# Patient Record
Sex: Male | Born: 1985 | Race: White | Hispanic: No | Marital: Married | Smoking: Current every day smoker
Health system: Southern US, Community
[De-identification: ages and names within clinical notes are randomized; demographics above are authoritative.]

---
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
## 2008-01-24 ENCOUNTER — Emergency Department (HOSPITAL_COMMUNITY): Admission: EM | Admit: 2008-01-24 | Discharge: 2008-01-24 | Payer: Self-pay | Admitting: Emergency Medicine

## 2009-02-27 ENCOUNTER — Emergency Department: Payer: Self-pay | Admitting: Emergency Medicine

## 2010-07-08 ENCOUNTER — Emergency Department (HOSPITAL_COMMUNITY): Admission: EM | Admit: 2010-07-08 | Discharge: 2010-07-08 | Payer: Self-pay | Admitting: Emergency Medicine

## 2010-07-10 ENCOUNTER — Emergency Department (HOSPITAL_COMMUNITY): Admission: EM | Admit: 2010-07-10 | Discharge: 2010-07-10 | Payer: Self-pay | Admitting: Emergency Medicine

## 2010-10-05 ENCOUNTER — Emergency Department (HOSPITAL_COMMUNITY)
Admission: EM | Admit: 2010-10-05 | Discharge: 2010-10-05 | Payer: Self-pay | Source: Home / Self Care | Admitting: Emergency Medicine

## 2011-05-16 ENCOUNTER — Emergency Department (HOSPITAL_COMMUNITY)
Admission: EM | Admit: 2011-05-16 | Discharge: 2011-05-17 | Disposition: A | Discharge status: Home / Self Care | Payer: BC Managed Care – PPO | Attending: Emergency Medicine | Admitting: Emergency Medicine

## 2011-05-16 DIAGNOSIS — S0510XA Contusion of eyeball and orbital tissues, unspecified eye, initial encounter: Secondary | ICD-10-CM | POA: Insufficient documentation

## 2011-05-16 DIAGNOSIS — R51 Headache: Secondary | ICD-10-CM | POA: Insufficient documentation

## 2011-05-16 DIAGNOSIS — H538 Other visual disturbances: Secondary | ICD-10-CM | POA: Insufficient documentation

## 2011-05-16 DIAGNOSIS — H5710 Ocular pain, unspecified eye: Secondary | ICD-10-CM | POA: Insufficient documentation

## 2011-05-16 DIAGNOSIS — S058X9A Other injuries of unspecified eye and orbit, initial encounter: Secondary | ICD-10-CM | POA: Insufficient documentation

## 2011-05-16 DIAGNOSIS — H53149 Visual discomfort, unspecified: Secondary | ICD-10-CM | POA: Insufficient documentation

## 2011-05-16 DIAGNOSIS — IMO0002 Reserved for concepts with insufficient information to code with codable children: Secondary | ICD-10-CM | POA: Insufficient documentation

## 2015-02-23 ENCOUNTER — Emergency Department (HOSPITAL_COMMUNITY)
Admission: EM | Admit: 2015-02-23 | Discharge: 2015-02-23 | Disposition: A | Discharge status: Home / Self Care | Payer: Self-pay | Attending: Emergency Medicine | Admitting: Emergency Medicine

## 2015-02-23 ENCOUNTER — Encounter (HOSPITAL_COMMUNITY): Payer: Self-pay | Admitting: Physical Medicine and Rehabilitation

## 2015-02-23 ENCOUNTER — Emergency Department (HOSPITAL_COMMUNITY): Payer: Self-pay

## 2015-02-23 DIAGNOSIS — Y9389 Activity, other specified: Secondary | ICD-10-CM | POA: Insufficient documentation

## 2015-02-23 DIAGNOSIS — S62346A Nondisplaced fracture of base of fifth metacarpal bone, right hand, initial encounter for closed fracture: Secondary | ICD-10-CM | POA: Insufficient documentation

## 2015-02-23 DIAGNOSIS — Z720 Tobacco use: Secondary | ICD-10-CM | POA: Insufficient documentation

## 2015-02-23 DIAGNOSIS — Y998 Other external cause status: Secondary | ICD-10-CM | POA: Insufficient documentation

## 2015-02-23 DIAGNOSIS — Y9289 Other specified places as the place of occurrence of the external cause: Secondary | ICD-10-CM | POA: Insufficient documentation

## 2015-02-23 DIAGNOSIS — S62339A Displaced fracture of neck of unspecified metacarpal bone, initial encounter for closed fracture: Secondary | ICD-10-CM

## 2015-02-23 MED ORDER — IBUPROFEN 800 MG PO TABS: 800.0000 mg | ORAL_TABLET | Freq: Three times a day (TID) | ORAL | Status: AC

## 2015-02-23 MED ORDER — HYDROCODONE-ACETAMINOPHEN 5-325 MG PO TABS: 1.0000 | ORAL_TABLET | ORAL | Status: AC | PRN
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
## 2015-02-23 NOTE — Progress Notes (Signed)
Orthopedic Tech Progress Note Patient Details:  Derrick Thompson Marrow 07/04/1986 [id number]  Ortho Devices Type of Ortho Device: Ulna gutter splint Ortho Device/Splint Location: rue Ortho Device/Splint Interventions: Application   Nancyjo Givhan 02/23/2015, 1:50 PM

## 2015-02-23 NOTE — ED Notes (Signed)
Paged ortho to request short arm splint
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
## 2015-02-23 NOTE — ED Notes (Signed)
Ortho at bedside.
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
## 2016-03-18 ENCOUNTER — Emergency Department (HOSPITAL_COMMUNITY)
Admission: EM | Admit: 2016-03-18 | Discharge: 2016-03-18 | Disposition: A | Discharge status: Home / Self Care | Payer: Self-pay | Attending: Emergency Medicine | Admitting: Emergency Medicine

## 2016-03-18 ENCOUNTER — Encounter (HOSPITAL_COMMUNITY): Payer: Self-pay | Admitting: Emergency Medicine

## 2016-03-18 DIAGNOSIS — H9312 Tinnitus, left ear: Secondary | ICD-10-CM | POA: Insufficient documentation

## 2016-03-18 DIAGNOSIS — Z791 Long term (current) use of non-steroidal anti-inflammatories (NSAID): Secondary | ICD-10-CM | POA: Insufficient documentation

## 2016-03-18 DIAGNOSIS — F17200 Nicotine dependence, unspecified, uncomplicated: Secondary | ICD-10-CM | POA: Insufficient documentation

## 2016-03-18 DIAGNOSIS — H6122 Impacted cerumen, left ear: Secondary | ICD-10-CM | POA: Insufficient documentation

## 2016-03-18 DIAGNOSIS — R42 Dizziness and giddiness: Secondary | ICD-10-CM | POA: Insufficient documentation

## 2016-03-18 MED ORDER — DOCUSATE SODIUM 50 MG/5ML PO LIQD
50.0000 mg | Freq: Once | ORAL | Status: AC
  Administered 2016-03-18: 50 mg
  Filled 2016-03-18: qty 10

## 2016-03-18 NOTE — ED Notes (Signed)
Started with left ear "stopped up" 4-5 weeks ago. Has a friend in audiology who 'looked at it' and said it was badly impacted with wax. Pt has been trying home treatments in an attempt to clear the wax, without success.

## 2016-03-18 NOTE — Discharge Instructions (Signed)
Please read and follow all provided instructions.  Your diagnoses today include:  1. Cerumen impaction, left    Tests performed today include:  Vital signs. See below for your results today.   Medications prescribed:   None  Home care instructions:  Follow any educational materials contained in this packet.  Follow-up instructions: Please follow-up with your primary care provider for further evaluation of symptoms and treatment   Return instructions:   Please return to the Emergency Department if you do not get better, if you get worse, or new symptoms OR  - Fever (temperature greater than 101.8F)  - Bleeding that does not stop with holding pressure to the area    -Severe pain (please note that you may be more sore the day after your accident)  - Chest Pain  - Difficulty breathing  - Severe nausea or vomiting  - Inability to tolerate food and liquids  - Passing out  - Skin becoming red around your wounds  - Change in mental status (confusion or lethargy)  - New numbness or weakness     Please return if you have any other emergent concerns.  Additional Information:  Your vital signs today were: BP 131/89 mmHg   Pulse 84   Temp(Src) 97.8 F (36.6 C) (Oral)   Resp 18   Ht 6\' 4"  (1.93 m)   Wt 83.915 kg   BMI 22.53 kg/m2   SpO2 97% If your blood pressure (BP) was elevated above 135/85 this visit, please have this repeated by your doctor within one month. ---------------

## 2016-03-18 NOTE — ED Notes (Signed)
Requested med from pharmacy  

## 2016-03-18 NOTE — ED Provider Notes (Signed)
History  By signing my name below, I, Derrick Thompson, attest that this documentation has been prepared under the direction and in the presence of Audry Piliyler Shaheed Schmuck, PA-C. Electronically Signed: Karle PlumberJennifer Thompson, ED Scribe. 03/18/2016. 11:22 AM.  No chief complaint on file.  The history is provided by the patient and medical records. No language interpreter was used.    HPI Comments:  Derrick Thompson is a 30 y.o. male who presents to the Emergency Department complaining of decreased hearing in the left ear that has been ongoing for about 5 weeks. He reports associated tinnitus, mild dizziness and mild pain. He rates the pain at 6/10 but reports it as mild. He states a friend of his is an Biomedical scientistaudiologist and said he had an impaction after exploring it with a camera. He has been using OTC products to clear out the cerumen impaction without success. He denies modifying factors. He denies fever, chills, nausea, vomiting.   History reviewed. No pertinent past medical history. History reviewed. No pertinent past surgical history. No family history on file. Social History  Substance Use Topics  . Smoking status: Current Every Day Smoker  . Smokeless tobacco: None  . Alcohol Use: Yes    Review of Systems A complete 10 system review of systems was obtained and all systems are negative except as noted in the HPI and PMH.   Allergies  Review of patient's allergies indicates no known allergies.  Home Medications   Prior to Admission medications   Medication Sig Start Date End Date Taking? Authorizing Provider  HYDROcodone-acetaminophen (NORCO/VICODIN) 5-325 MG per tablet Take 1-2 tablets by mouth every 4 (four) hours as needed. 02/23/15   Tiffany Neva SeatGreene, PA-C  ibuprofen (ADVIL,MOTRIN) 800 MG tablet Take 1 tablet (800 mg total) by mouth 3 (three) times daily. 02/23/15   Marlon Peliffany Greene, PA-C   Triage Vitals: BP 131/89 mmHg  Pulse 84  Temp(Src) 97.8 F (36.6 C) (Oral)  Resp 18  Ht 6\' 4"  (1.93 m)  Wt  185 lb (83.915 kg)  BMI 22.53 kg/m2  SpO2 97%  Physical Exam  Constitutional: He is oriented to person, place, and time. He appears well-developed and well-nourished.  HENT:  Head: Normocephalic and atraumatic.  Right Ear: Hearing, tympanic membrane, external ear and ear canal normal.  Left ear with impacted canal with Cerumen. Unable to visualized TM. Canal non erythematous. No mastoid tenderness.   Eyes: EOM are normal.  Neck: Normal range of motion.  Cardiovascular: Normal rate and regular rhythm.   Pulmonary/Chest: Effort normal.  Abdominal: Soft.  Musculoskeletal: Normal range of motion.  Neurological: He is alert and oriented to person, place, and time.  Skin: Skin is warm and dry.  Psychiatric: He has a normal mood and affect. His behavior is normal. Thought content normal.  Nursing note and vitals reviewed.  ED Course  Procedures (including critical care time) DIAGNOSTIC STUDIES: Oxygen Saturation is 97% on RA, normal by my interpretation.   COORDINATION OF CARE: 11:22 AM- Will irrigate left ear. Pt verbalizes understanding and agrees to plan.  Medications - No data to display  Labs Review Labs Reviewed - No data to display  Imaging Review No results found. I have personally reviewed and evaluated these images and lab results as part of my medical decision-making.   EKG Interpretation None      MDM  I have reviewed the relevant previous healthcare records. I obtained HPI from historian. Patient discussed with supervising physician  ED Course:  Assessment: Pt is a 29yM  who presents with left ear impaction. On exam, pt in NAD. Nontoxic/nonseptic appearing. VSS. Afebrile. Visibly impacted ear canal. Attempted manual disimpaction as well as hydrogen peroxide mixed with water. Colace successful with improvement of cerumen impaction. TMs clear and non erythematous. Plan is to Dc Home with follow up to PCP. At time of discharge, Patient is in no acute distress. Vital  Signs are stable. Patient is able to ambulate. Patient able to tolerate PO.    Disposition/Plan:  DC home Additional Verbal discharge instructions given and discussed with patient.  Pt Instructed to f/u with PCP in the next 48 hours for evaluation and treatment of symptoms. Return precautions given Pt acknowledges and agrees with plan  Supervising Physician Melene Plan, DO   Final diagnoses:  Cerumen impaction, left   I personally performed the services described in this documentation, which was scribed in my presence. The recorded information has been reviewed and is accurate.   Audry Pili, PA-C 03/18/16 1300  Melene Plan, DO 03/18/16 1438
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
## 2018-02-18 ENCOUNTER — Emergency Department: Payer: Self-pay

## 2018-02-18 ENCOUNTER — Other Ambulatory Visit: Payer: Self-pay

## 2018-02-18 ENCOUNTER — Emergency Department
Admission: EM | Admit: 2018-02-18 | Discharge: 2018-02-18 | Disposition: A | Discharge status: Home / Self Care | Payer: Self-pay | Attending: Emergency Medicine | Admitting: Emergency Medicine

## 2018-02-18 ENCOUNTER — Encounter: Payer: Self-pay | Admitting: Emergency Medicine

## 2018-02-18 DIAGNOSIS — F17200 Nicotine dependence, unspecified, uncomplicated: Secondary | ICD-10-CM | POA: Insufficient documentation

## 2018-02-18 DIAGNOSIS — R079 Chest pain, unspecified: Secondary | ICD-10-CM | POA: Insufficient documentation

## 2018-02-18 DIAGNOSIS — K2920 Alcoholic gastritis without bleeding: Secondary | ICD-10-CM | POA: Insufficient documentation

## 2018-02-18 LAB — CBC
HCT: 47.3 % (ref 40.0–52.0)
HEMOGLOBIN: 16.1 g/dL (ref 13.0–18.0)
MCH: 30.7 pg (ref 26.0–34.0)
MCHC: 34 g/dL (ref 32.0–36.0)
MCV: 90.1 fL (ref 80.0–100.0)
PLATELETS: 183 10*3/uL (ref 150–440)
RBC: 5.24 MIL/uL (ref 4.40–5.90)
RDW: 13.3 % (ref 11.5–14.5)
WBC: 12.1 10*3/uL — AB (ref 3.8–10.6)

## 2018-02-18 LAB — BASIC METABOLIC PANEL
ANION GAP: 12 (ref 5–15)
BUN: 14 mg/dL (ref 6–20)
CALCIUM: 9 mg/dL (ref 8.9–10.3)
CHLORIDE: 105 mmol/L (ref 101–111)
CO2: 21 mmol/L — AB (ref 22–32)
CREATININE: 1.11 mg/dL (ref 0.61–1.24)
GFR calc non Af Amer: >60 mL/min (ref >60)
Glucose, Bld: 113 mg/dL — ABNORMAL HIGH (ref 65–99)
Potassium: 3.6 mmol/L (ref 3.5–5.1)
SODIUM: 138 mmol/L (ref 135–145)

## 2018-02-18 LAB — LIPASE, BLOOD: Lipase: 23 U/L (ref 11–51)

## 2018-02-18 LAB — TROPONIN I

## 2018-02-18 MED ORDER — GI COCKTAIL ~~LOC~~
30.0000 mL | Freq: Once | ORAL | Status: AC
  Administered 2018-02-18: 30 mL via ORAL
  Filled 2018-02-18: qty 30

## 2018-02-18 MED ORDER — KETOROLAC TROMETHAMINE 30 MG/ML IJ SOLN
30.0000 mg | Freq: Once | INTRAMUSCULAR | Status: AC
  Administered 2018-02-18: 30 mg via INTRAVENOUS
  Filled 2018-02-18: qty 1

## 2018-02-18 MED ORDER — PANTOPRAZOLE SODIUM 40 MG PO TBEC: 40.0000 mg | DELAYED_RELEASE_TABLET | Freq: Every day | ORAL | 0 refills | Status: AC

## 2018-02-18 MED ORDER — PANTOPRAZOLE SODIUM 40 MG PO TBEC
40.0000 mg | DELAYED_RELEASE_TABLET | Freq: Once | ORAL | Status: AC
  Administered 2018-02-18: 40 mg via ORAL
  Filled 2018-02-18: qty 1

## 2018-02-18 MED ORDER — SUCRALFATE 1 G PO TABS
1.0000 g | ORAL_TABLET | Freq: Once | ORAL | Status: AC
  Administered 2018-02-18: 1 g via ORAL
  Filled 2018-02-18: qty 1

## 2018-02-18 NOTE — ED Triage Notes (Addendum)
Patient ambulatory to triage with steady gait, without difficulty, pt appears very anxious; pt reports midsternal CP tonight, nonradiating with no accomp symptoms; denies hx of same
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
## 2018-11-30 ENCOUNTER — Emergency Department (HOSPITAL_COMMUNITY)
Admission: EM | Admit: 2018-11-30 | Discharge: 2018-11-30 | Disposition: A | Discharge status: Home / Self Care | Payer: Self-pay | Attending: Emergency Medicine | Admitting: Emergency Medicine

## 2018-11-30 ENCOUNTER — Encounter (HOSPITAL_COMMUNITY): Payer: Self-pay | Admitting: *Deleted

## 2018-11-30 ENCOUNTER — Emergency Department (HOSPITAL_COMMUNITY): Payer: Self-pay

## 2018-11-30 DIAGNOSIS — Z5320 Procedure and treatment not carried out because of patient's decision for unspecified reasons: Secondary | ICD-10-CM | POA: Insufficient documentation

## 2018-11-30 DIAGNOSIS — R21 Rash and other nonspecific skin eruption: Secondary | ICD-10-CM | POA: Insufficient documentation

## 2018-11-30 DIAGNOSIS — Z79899 Other long term (current) drug therapy: Secondary | ICD-10-CM | POA: Insufficient documentation

## 2018-11-30 DIAGNOSIS — F17210 Nicotine dependence, cigarettes, uncomplicated: Secondary | ICD-10-CM | POA: Insufficient documentation

## 2018-11-30 NOTE — ED Triage Notes (Signed)
Pt in c/o generalized rash for the last week, developed a fever in the last few days as high as 102, denies recent travel, reports sore throat and cough

## 2018-11-30 NOTE — Discharge Instructions (Addendum)
Please read attached information. If you experience any new or worsening signs or symptoms please return to the emergency room for evaluation. Please follow-up with your primary care provider or specialist as discussed.  °

## 2018-11-30 NOTE — ED Provider Notes (Signed)
[facility] Provider Note   CSN: [id number] Arrival date & time: 11/30/18  1106   History   Chief Complaint Chief Complaint  Patient presents with  . Rash  . Fever    HPI Anette RiedelBrendon P Krieger is a 32 y.o. male.  HPI    32 year old male presents today with complaints of rash.  Patient notes approximately 2 weeks ago he developed rash on his right arm that has spread to the remainder of his upper extremities chest neck back and lower extremities.  Patient notes it is minimally itchy on the right arm.  Patient notes he feels at his baseline with no significant fatigue, joint aches, or fever.  He does note approximately a week ago he had a temperature of 102.  Patient denies any tick bites, abnormal exposures including topical exposures.  Patient notes he is tried hypoallergenic soaps and lotions with no improvement in symptoms.  No close contacts with similar symptoms.  He notes he is sexually active with one male partner with no signs of STD.   Patient denies any infectious symptoms presently.   History reviewed. No pertinent past medical history.  There are no active problems to display for this patient.   History reviewed. No pertinent surgical history.      Home Medications    Prior to Admission medications   Medication Sig Start Date End Date Taking? Authorizing Provider  HYDROcodone-acetaminophen (NORCO/VICODIN) 5-325 MG per tablet Take 1-2 tablets by mouth every 4 (four) hours as needed. 02/23/15   Marlon PelGreene, Tiffany, PA-C  ibuprofen (ADVIL,MOTRIN) 800 MG tablet Take 1 tablet (800 mg total) by mouth 3 (three) times daily. 02/23/15   Marlon PelGreene, Tiffany, PA-C  pantoprazole (PROTONIX) 40 MG tablet Take 1 tablet (40 mg total) by mouth daily. 02/18/18 03/20/18  Darci CurrentBrown, Oxford N, MD    Family History History reviewed. No pertinent family history.  Social History Social History   Tobacco Use  . Smoking status: Current Every Day Smoker  .  Smokeless tobacco: Never Used  Substance Use Topics  . Alcohol use: Yes  . Drug use: No     Allergies   Patient has no known allergies.   Review of Systems Review of Systems  All other systems reviewed and are negative.    Physical Exam Updated Vital Signs BP (!) 119/99   Pulse 73   Temp 98 F (36.7 C) (Oral)   Resp 17   SpO2 98%   Physical Exam Vitals signs and nursing note reviewed.  Constitutional:      Appearance: He is well-developed.  HENT:     Head: Normocephalic and atraumatic.     Comments: Oropharynx is clear with no lesions no erythema neck is supple full active range of motion Eyes:     General: No scleral icterus.       Right eye: No discharge.        Left eye: No discharge.     Conjunctiva/sclera: Conjunctivae normal.     Pupils: Pupils are equal, round, and reactive to light.  Neck:     Musculoskeletal: Normal range of motion.     Vascular: No JVD.     Trachea: No tracheal deviation.  Pulmonary:     Effort: Pulmonary effort is normal.     Breath sounds: No stridor.  Musculoskeletal:     Comments:  rash noted to be dermatome diffusely with sparing of the palms of the hand and minor involvement of the soles of the feet-there  is appearing plaque in nature along the elbow nightly papular to the remainder  Neurological:     Mental Status: He is alert and oriented to person, place, and time.     Coordination: Coordination normal.  Psychiatric:        Behavior: Behavior normal.        Thought Content: Thought content normal.        Judgment: Judgment normal.      ED Treatments / Results  Labs (all labs ordered are listed, but only abnormal results are displayed) Labs Reviewed - No data to display  EKG None  Radiology Dg Chest 2 View  Result Date: 11/30/2018 CLINICAL DATA:  Nonproductive cough, intermittent fever, runny nose, and rash on both arms for the past week. Current smoker. EXAM: CHEST - 2 VIEW COMPARISON:  PA and lateral chest  x-ray of February 18, 2018 FINDINGS: The lungs remain mildly hyperinflated. There is no focal infiltrate. There is no pleural effusion. The heart and pulmonary vascularity are normal. The mediastinum is normal in width. The trachea is midline. The bony thorax exhibits no acute abnormality. IMPRESSION: Mild hyperinflation, chronic.  No acute cardiopulmonary abnormality. Electronically Signed   By: David  SwazilandJordan M.D.   On: 11/30/2018 11:27    Procedures Procedures (including critical care time)  Medications Ordered in ED Medications - No data to display   Initial Impression / Assessment and Plan / ED Course  I have reviewed the triage vital signs and the nursing notes.  Pertinent labs & imaging results that were available during my care of the patient were reviewed by me and considered in my medical decision making (see chart for details).     32 year old male presents today with rash.  Uncertain etiology at this time although he is very well-appearing afebrile with no signs of infectious etiology.  This does appear to be a viral exanthem.  Some areas appear to be characteristic of pityriasis rosea, other areas unlikely.  Patient has no palmar involvement but has some involvement of the soles of his feet.  Discussed potential etiologies including [place] spotted fever, syphilis.  Patient not believe that his rash is either of these and would like no further testing or treatment.  He understands that by not pursuing testing at this time I would be unable to provide definitive diagnosis.  Patient was offered oral prednisone which he did not want.  Patient does have follow-up with dermatologist in 2 weeks, he is encouraged to make this appointment and return immediately if he develops any new or worsening signs or symptoms.  Patient verbalized understanding and agreement to this plan.  Final Clinical Impressions(s) / ED Diagnoses   Final diagnoses:  Rash    ED Discharge Orders    None         Eyvonne MechanicHedges, Aurie Harroun, PA-C 11/30/18 1602    Rolan BuccoBelfi, Melanie, MD 11/30/18 [phone]

## 2018-11-30 NOTE — ED Notes (Signed)
Pt stable, ambulatory, states understanding of discharge instructions 

## 2019-04-01 DEATH — deceased

## 2020-03-02 IMAGING — DX DG CHEST 2V
2 series · 2 of 2 positions shown · non-contrast
Comparison: PA and lateral chest x-ray February 18, 2018

CLINICAL DATA: Nonproductive cough, intermittent fever, runny nose,
and rash on both arms for the past week. Current smoker.

EXAM:
CHEST - 2 VIEW

[chest pa]
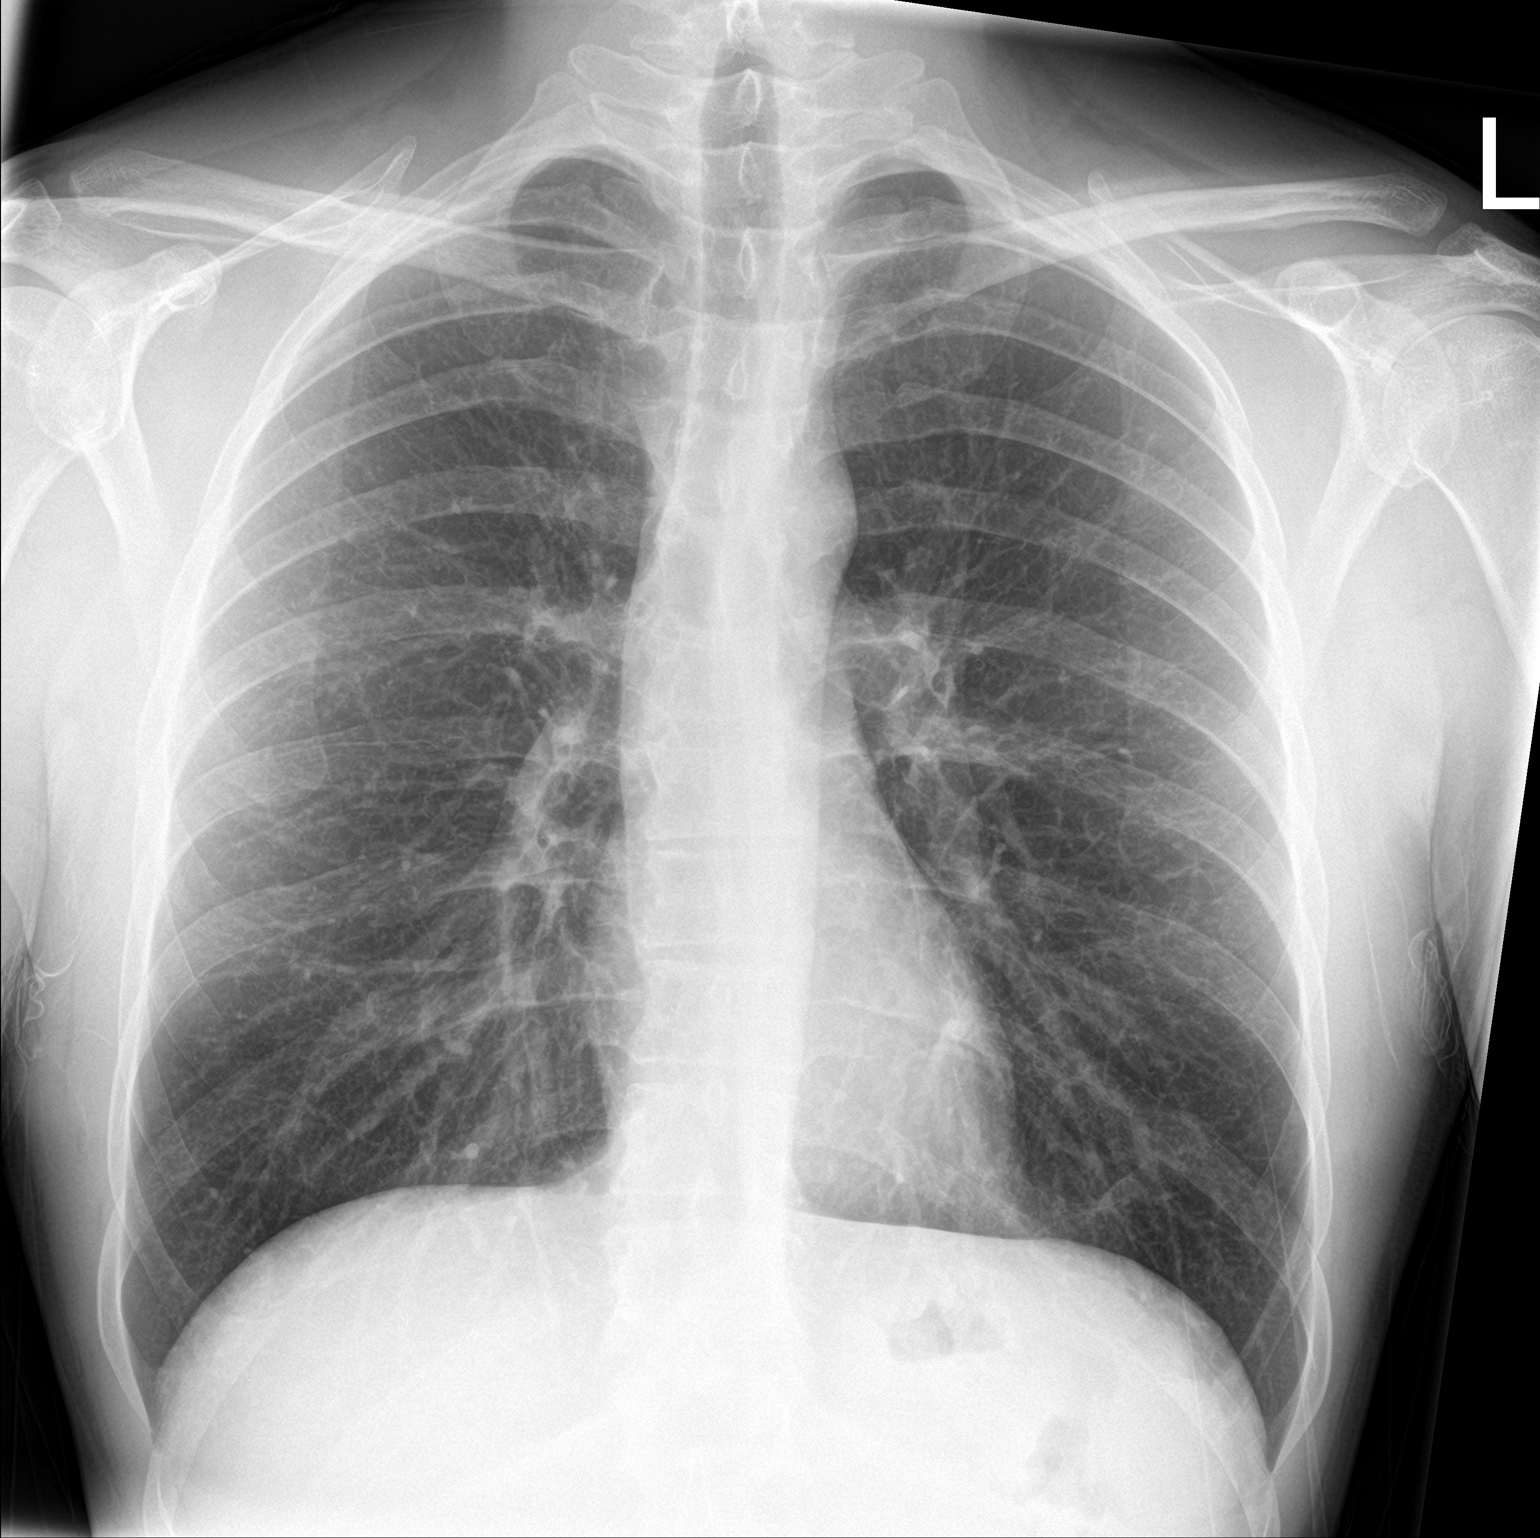

[chest lat]
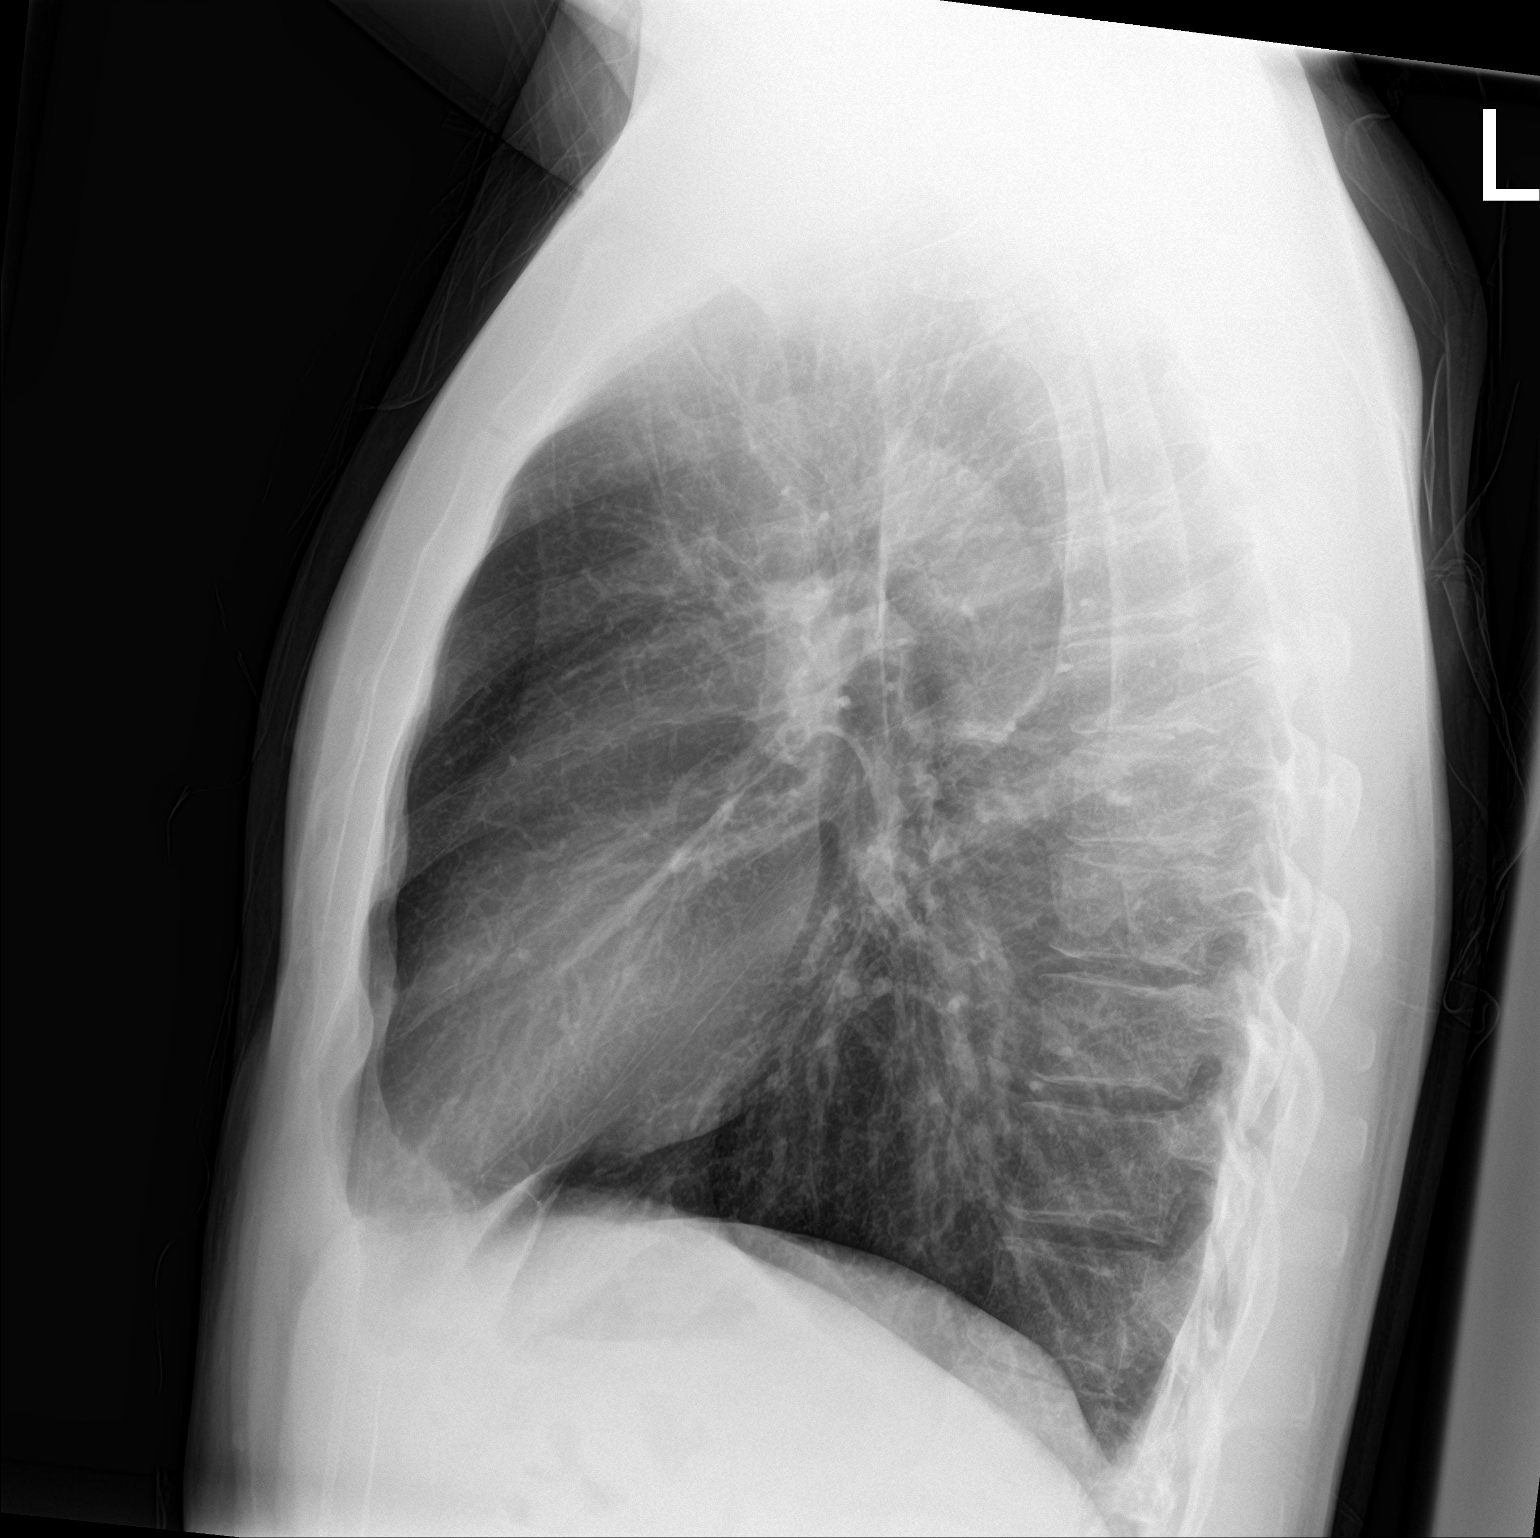

[2 of 2 positions shown; findings below may reference images not displayed]

FINDINGS: The lungs remain mildly hyperinflated. There is no focal infiltrate.
There is no pleural effusion. The heart and pulmonary vascularity
are normal. The mediastinum is normal in width. The trachea is
midline. The bony thorax exhibits no acute abnormality.
IMPRESSION: Mild hyperinflation, chronic.  No acute cardiopulmonary abnormality.
